# Patient Record
Sex: Female | Born: 1970 | Hispanic: Yes | Marital: Married | Smoking: Never smoker
Health system: Southern US, Community
[De-identification: ages and names within clinical notes are randomized; demographics above are authoritative.]

## PROBLEM LIST (undated history)

## (undated) HISTORY — PX: COLONOSCOPY: SHX174

## (undated) HISTORY — PX: LIPOMA EXCISION: SHX5283

---

## 2004-09-07 ENCOUNTER — Ambulatory Visit: Payer: Self-pay | Admitting: Otolaryngology

## 2016-06-04 ENCOUNTER — Other Ambulatory Visit: Payer: Self-pay | Admitting: Family Medicine

## 2016-06-04 ENCOUNTER — Ambulatory Visit
Admission: RE | Admit: 2016-06-04 | Discharge: 2016-06-04 | Disposition: A | Discharge status: Home / Self Care | Payer: Medicaid Other | Source: Ambulatory Visit | Attending: Family Medicine | Admitting: Family Medicine

## 2016-06-04 DIAGNOSIS — R937 Abnormal findings on diagnostic imaging of other parts of musculoskeletal system: Secondary | ICD-10-CM | POA: Insufficient documentation

## 2016-06-04 DIAGNOSIS — T1490XA Injury, unspecified, initial encounter: Secondary | ICD-10-CM

## 2016-06-04 DIAGNOSIS — W11XXXA Fall on and from ladder, initial encounter: Secondary | ICD-10-CM | POA: Insufficient documentation

## 2016-06-04 DIAGNOSIS — R102 Pelvic and perineal pain: Secondary | ICD-10-CM | POA: Insufficient documentation

## 2016-06-04 DIAGNOSIS — S300XXA Contusion of lower back and pelvis, initial encounter: Secondary | ICD-10-CM | POA: Insufficient documentation

## 2016-06-18 ENCOUNTER — Other Ambulatory Visit: Payer: Self-pay | Admitting: Specialist

## 2016-06-18 DIAGNOSIS — R102 Pelvic and perineal pain: Secondary | ICD-10-CM

## 2016-06-21 ENCOUNTER — Other Ambulatory Visit: Payer: Medicaid Other

## 2016-06-22 ENCOUNTER — Ambulatory Visit
Admission: RE | Admit: 2016-06-22 | Discharge: 2016-06-22 | Disposition: A | Discharge status: Home / Self Care | Payer: Medicaid Other | Source: Ambulatory Visit | Attending: Specialist | Admitting: Specialist

## 2016-06-22 DIAGNOSIS — R102 Pelvic and perineal pain: Secondary | ICD-10-CM | POA: Insufficient documentation

## 2020-09-08 ENCOUNTER — Other Ambulatory Visit: Payer: Self-pay | Admitting: Obstetrics and Gynecology

## 2020-09-08 DIAGNOSIS — Z1231 Encounter for screening mammogram for malignant neoplasm of breast: Secondary | ICD-10-CM

## 2020-09-30 ENCOUNTER — Other Ambulatory Visit: Payer: Self-pay

## 2020-09-30 ENCOUNTER — Ambulatory Visit
Admission: RE | Admit: 2020-09-30 | Discharge: 2020-09-30 | Disposition: A | Discharge status: Home / Self Care | Payer: 59 | Source: Ambulatory Visit | Attending: Obstetrics and Gynecology | Admitting: Obstetrics and Gynecology

## 2020-09-30 DIAGNOSIS — Z1231 Encounter for screening mammogram for malignant neoplasm of breast: Secondary | ICD-10-CM | POA: Diagnosis not present

## 2020-10-03 ENCOUNTER — Inpatient Hospital Stay
Admission: RE | Admit: 2020-10-03 | Discharge: 2020-10-03 | Disposition: A | Discharge status: Home / Self Care | Payer: Self-pay | Source: Ambulatory Visit | Attending: *Deleted | Admitting: *Deleted

## 2020-10-03 ENCOUNTER — Other Ambulatory Visit: Payer: Self-pay | Admitting: *Deleted

## 2020-10-03 DIAGNOSIS — Z1231 Encounter for screening mammogram for malignant neoplasm of breast: Secondary | ICD-10-CM

## 2021-03-27 ENCOUNTER — Other Ambulatory Visit
Admission: RE | Admit: 2021-03-27 | Discharge: 2021-03-27 | Disposition: A | Discharge status: Home / Self Care | Payer: 59 | Source: Ambulatory Visit | Attending: Surgery | Admitting: Surgery

## 2021-03-27 ENCOUNTER — Ambulatory Visit: Payer: Self-pay | Admitting: Surgery

## 2021-03-27 ENCOUNTER — Other Ambulatory Visit: Payer: Self-pay

## 2021-03-27 NOTE — Patient Instructions (Addendum)
Your procedure is scheduled on: 03/30/21 - Thursday Report to the Registration Desk on the 1st floor of the [facility]. To find out your arrival time, please call [phone] between 1PM - 3PM on: 03/29/21 - Wednesday  REMEMBER: Instructions that are not followed completely may result in serious medical risk, up to and including death; or upon the discretion of your surgeon and anesthesiologist your surgery may need to be rescheduled.  Do not eat food after midnight the night before surgery.  No gum chewing, lozengers or hard candies.  You may however, drink CLEAR liquids up to 2 hours before you are scheduled to arrive for your surgery. Do not drink anything within 2 hours of your scheduled arrival time.  Clear liquids include: - water  - apple juice without pulp - gatorade (not RED, PURPLE, OR BLUE) - black coffee or tea (Do NOT add milk or creamers to the coffee or tea) Do NOT drink anything that is not on this list.  TAKE THESE MEDICATIONS THE MORNING OF SURGERY WITH A SIP OF WATER: NONE  One week prior to surgery: Stop Anti-inflammatories (NSAIDS) such as Advil, Aleve, Ibuprofen, Motrin, Naproxen, Naprosyn and Aspirin based products such as Excedrin, Goodys Powder, BC Powder.  Stop ANY OVER THE COUNTER supplements until after surgery.  You may however, continue to take Tylenol if needed for pain up until the day of surgery.  No Alcohol for 24 hours before or after surgery.  No Smoking including e-cigarettes for 24 hours prior to surgery.  No chewable tobacco products for at least 6 hours prior to surgery.  No nicotine patches on the day of surgery.  Do not use any "recreational" drugs for at least a week prior to your surgery.  Please be advised that the combination of cocaine and anesthesia may have negative outcomes, up to and including death. If you test positive for cocaine, your surgery will be cancelled.  On the morning of surgery brush your teeth with toothpaste  and water, you may rinse your mouth with mouthwash if you wish. Do not swallow any toothpaste or mouthwash.  Use CHG Soap or wipes as directed on instruction sheet.  Do not wear jewelry, make-up, hairpins, clips or nail polish.  Do not wear lotions, powders, or perfumes.   Do not shave body from the neck down 48 hours prior to surgery just in case you cut yourself which could leave a site for infection.  Also, freshly shaved skin may become irritated if using the CHG soap.  Contact lenses, hearing aids and dentures may not be worn into surgery.  Do not bring valuables to the hospital. [facility] is not responsible for any missing/lost belongings or valuables.   Notify your doctor if there is any change in your medical condition (cold, fever, infection).  Wear comfortable clothing (specific to your surgery type) to the hospital.  After surgery, you can help prevent lung complications by doing breathing exercises.  Take deep breaths and cough every 1-2 hours. Your doctor may order a device called an Incentive Spirometer to help you take deep breaths. When coughing or sneezing, hold a pillow firmly against your incision with both hands. This is called splinting. Doing this helps protect your incision. It also decreases belly discomfort.  If you are being admitted to the hospital overnight, leave your suitcase in the car. After surgery it may be brought to your room.  If you are being discharged the day of surgery, you will not be allowed  to drive home. You will need a responsible adult (18 years or older) to drive you home and stay with you that night.   If you are taking public transportation, you will need to have a responsible adult (18 years or older) with you. Please confirm with your physician that it is acceptable to use public transportation.   Please call the [facility] Dept. at [phone] if you have any questions about these instructions.  Surgery  Visitation Policy:  Patients undergoing a surgery or procedure may have one family member or support person with them as long as that person is not COVID-19 positive or experiencing its symptoms.  That person may remain in the waiting area during the procedure and may rotate out with other people.  Inpatient Visitation:    Visiting hours are 7 a.m. to 8 p.m. Up to two visitors ages 16+ are allowed at one time in a patient room. The visitors may rotate out with other people during the day. Visitors must check out when they leave, or other visitors will not be allowed. One designated support person may remain overnight. The visitor must pass COVID-19 screenings, use hand sanitizer when entering and exiting the patients room and wear a mask at all times, including in the patients room. Patients must also wear a mask when staff or their visitor are in the room. Masking is required regardless of vaccination status.

## 2021-03-27 NOTE — H&P (Signed)
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 female who was referred by Cristy Folks, CNM for evaluation of above. First noted 1 year ago.  Asymptomatic but increasing in size.   Pt also due for colonoscopy.  Had one over 82yrs ago.  Asymptomatic otherwise.   Past Medical History: none reported   Past Surgical History:  has a past surgical history that includes other surgery (2007).   Family History: family history is not on file.   Social History:  reports that she has never smoked. She has never used smokeless tobacco. She reports that she does not currently use alcohol. She reports that she does not use drugs.   Current Medications: none reported   Allergies:  No Known Allergies   ROS:  A 15 point review of systems was performed and pertinent positives and negatives noted in HPI   Objective:   BP 131/84    Pulse 81    Ht 162.6 cm (5\' 4" )    Wt 84.8 kg (187 lb)    BMI 32.10 kg/m    Constitutional :  No distress, cooperative, alert Lymphatics/Throat:  Supple with no lymphadenopathy Respiratory:  Clear to auscultation bilaterally Cardiovascular:  Regular rate and rhythm Gastrointestinal: Soft, non-tender, non-distended, no organomegaly. 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  Alternatives include continued observation.  Benefits include possible symptom relief, pathologic evaluation, improved cosmesis. Discussed the risk of surgery including recurrence, chronic pain, post-op infxn, poor cosmesis, poor/delayed wound healing, and possible re-operation to address said risks. The risks of general  anesthetic, if used, includes MI, CVA, sudden death or even reaction to anesthetic medications also discussed.  Typical post-op recovery time of 3-5 days with possible activity restrictions were also discussed.   The patient verbalized understanding and all questions were answered to the patient's satisfaction.   Patient has elected to proceed with surgical treatment. Procedure will be scheduled. OR due to location and bleeding risk.   2. Colonoscopy Screening Discussed r/b/a of colonoscopy and indication for screening based on her age.  Also discussed cologuard.  She declined both at this time due to anxiety, but stated she will think about it.

## 2021-03-27 NOTE — H&P (View-Only) (Signed)
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 female who was referred by Cristy Folks, CNM for evaluation of above. First noted 1 year ago.  Asymptomatic but increasing in size.   Pt also due for colonoscopy.  Had one over 11yrs ago.  Asymptomatic otherwise.   Past Medical History: none reported   Past Surgical History:  has a past surgical history that includes other surgery (2007).   Family History: family history is not on file.   Social History:  reports that she has never smoked. She has never used smokeless tobacco. She reports that she does not currently use alcohol. She reports that she does not use drugs.   Current Medications: none reported   Allergies:  No Known Allergies   ROS:  A 15 point review of systems was performed and pertinent positives and negatives noted in HPI   Objective:   BP 131/84    Pulse 81    Ht 162.6 cm (5\' 4" )    Wt 84.8 kg (187 lb)    BMI 32.10 kg/m    Constitutional :  No distress, cooperative, alert Lymphatics/Throat:  Supple with no lymphadenopathy Respiratory:  Clear to auscultation bilaterally Cardiovascular:  Regular rate and rhythm Gastrointestinal: Soft, non-tender, non-distended, no organomegaly. 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  Alternatives include continued observation.  Benefits include possible symptom relief, pathologic evaluation, improved cosmesis. Discussed the risk of surgery including recurrence, chronic pain, post-op infxn, poor cosmesis, poor/delayed wound healing, and possible re-operation to address said risks. The risks of general  anesthetic, if used, includes MI, CVA, sudden death or even reaction to anesthetic medications also discussed.  Typical post-op recovery time of 3-5 days with possible activity restrictions were also discussed.   The patient verbalized understanding and all questions were answered to the patient's satisfaction.   Patient has elected to proceed with surgical treatment. Procedure will be scheduled. OR due to location and bleeding risk.   2. Colonoscopy Screening Discussed r/b/a of colonoscopy and indication for screening based on her age.  Also discussed cologuard.  She declined both at this time due to anxiety, but stated she will think about it.

## 2021-03-29 MED ORDER — CHLORHEXIDINE GLUCONATE CLOTH 2 % EX PADS: 6.0000 | MEDICATED_PAD | Freq: Once | CUTANEOUS | Status: DC

## 2021-03-29 MED ORDER — FAMOTIDINE 20 MG PO TABS: 20.0000 mg | ORAL_TABLET | Freq: Once | ORAL | Status: AC

## 2021-03-29 MED ORDER — CEFAZOLIN SODIUM-DEXTROSE 2-4 GM/100ML-% IV SOLN
2.0000 g | INTRAVENOUS | Status: AC
  Administered 2021-03-30: 2 g via INTRAVENOUS

## 2021-03-29 MED ORDER — LACTATED RINGERS IV SOLN: INTRAVENOUS | Status: DC

## 2021-03-29 MED ORDER — ORAL CARE MOUTH RINSE: 15.0000 mL | Freq: Once | OROMUCOSAL | Status: AC

## 2021-03-29 MED ORDER — CHLORHEXIDINE GLUCONATE 0.12 % MT SOLN: 15.0000 mL | Freq: Once | OROMUCOSAL | Status: AC

## 2021-03-30 ENCOUNTER — Ambulatory Visit: Payer: 59 | Admitting: Anesthesiology

## 2021-03-30 ENCOUNTER — Other Ambulatory Visit: Payer: Self-pay

## 2021-03-30 ENCOUNTER — Encounter: Payer: Self-pay | Admitting: Surgery

## 2021-03-30 ENCOUNTER — Ambulatory Visit
Admission: RE | Admit: 2021-03-30 | Discharge: 2021-03-30 | Disposition: A | Discharge status: Home / Self Care | Payer: 59 | Attending: Surgery | Admitting: Surgery

## 2021-03-30 ENCOUNTER — Encounter
Admission: RE | Disposition: A | Discharge status: Home / Self Care | Payer: Self-pay | Source: Home / Self Care | Attending: Surgery

## 2021-03-30 DIAGNOSIS — D170 Benign lipomatous neoplasm of skin and subcutaneous tissue of head, face and neck: Secondary | ICD-10-CM | POA: Insufficient documentation

## 2021-03-30 HISTORY — PX: LIPOMA EXCISION: SHX5283

## 2021-03-30 LAB — POCT PREGNANCY, URINE: Preg Test, Ur: NEGATIVE

## 2021-03-30 SURGERY — Surgical Case
Anesthesia: *Unknown

## 2021-03-30 SURGERY — EXCISION LIPOMA
Anesthesia: General | Site: Face | Laterality: Left

## 2021-03-30 MED ORDER — DOCUSATE SODIUM 100 MG PO CAPS: 100.0000 mg | ORAL_CAPSULE | Freq: Two times a day (BID) | ORAL | 0 refills | Status: AC | PRN

## 2021-03-30 MED ORDER — DEXAMETHASONE SODIUM PHOSPHATE 10 MG/ML IJ SOLN
INTRAMUSCULAR | Status: DC | PRN
Start: 2021-03-30 — End: 2021-03-30
  Administered 2021-03-30: 10 mg via INTRAVENOUS

## 2021-03-30 MED ORDER — LIDOCAINE HCL (CARDIAC) PF 100 MG/5ML IV SOSY
PREFILLED_SYRINGE | INTRAVENOUS | Status: DC | PRN
  Administered 2021-03-30: 80 mg via INTRAVENOUS

## 2021-03-30 MED ORDER — BUPIVACAINE-EPINEPHRINE 0.5% -1:200000 IJ SOLN
INTRAMUSCULAR | Status: DC | PRN
  Administered 2021-03-30: 1 mL

## 2021-03-30 MED ORDER — ACETAMINOPHEN 325 MG PO TABS: 650.0000 mg | ORAL_TABLET | Freq: Three times a day (TID) | ORAL | 0 refills | Status: DC | PRN

## 2021-03-30 MED ORDER — LIDOCAINE HCL (PF) 1 % IJ SOLN
INTRAMUSCULAR | Status: AC
  Filled 2021-03-30: qty 30

## 2021-03-30 MED ORDER — FENTANYL CITRATE (PF) 100 MCG/2ML IJ SOLN
INTRAMUSCULAR | Status: DC | PRN
  Administered 2021-03-30: 25 ug via INTRAVENOUS
  Administered 2021-03-30: 50 ug via INTRAVENOUS
  Administered 2021-03-30: 25 ug via INTRAVENOUS

## 2021-03-30 MED ORDER — ONDANSETRON HCL 4 MG/2ML IJ SOLN
INTRAMUSCULAR | Status: AC
  Filled 2021-03-30: qty 2

## 2021-03-30 MED ORDER — LIDOCAINE HCL (PF) 2 % IJ SOLN
INTRAMUSCULAR | Status: AC
  Filled 2021-03-30: qty 5

## 2021-03-30 MED ORDER — PHENYLEPHRINE HCL (PRESSORS) 10 MG/ML IV SOLN
INTRAVENOUS | Status: AC
  Filled 2021-03-30: qty 1

## 2021-03-30 MED ORDER — ROCURONIUM BROMIDE 10 MG/ML (PF) SYRINGE
PREFILLED_SYRINGE | INTRAVENOUS | Status: AC
  Filled 2021-03-30: qty 10

## 2021-03-30 MED ORDER — LIDOCAINE HCL (PF) 1 % IJ SOLN
INTRAMUSCULAR | Status: DC | PRN
  Administered 2021-03-30: 1 mL

## 2021-03-30 MED ORDER — MIDAZOLAM HCL 2 MG/2ML IJ SOLN
INTRAMUSCULAR | Status: AC
  Filled 2021-03-30: qty 2

## 2021-03-30 MED ORDER — ACETAMINOPHEN 10 MG/ML IV SOLN
INTRAVENOUS | Status: DC | PRN
Start: 2021-03-30 — End: 2021-03-30
  Administered 2021-03-30: 1000 mg via INTRAVENOUS

## 2021-03-30 MED ORDER — SUGAMMADEX SODIUM 200 MG/2ML IV SOLN
INTRAVENOUS | Status: DC | PRN
  Administered 2021-03-30 (×2): 200 mg via INTRAVENOUS

## 2021-03-30 MED ORDER — HYDROCODONE-ACETAMINOPHEN 5-325 MG PO TABS
ORAL_TABLET | ORAL | Status: AC
  Filled 2021-03-30: qty 1

## 2021-03-30 MED ORDER — ONDANSETRON HCL 4 MG/2ML IJ SOLN
INTRAMUSCULAR | Status: DC | PRN
  Administered 2021-03-30: 4 mg via INTRAVENOUS

## 2021-03-30 MED ORDER — PROPOFOL 10 MG/ML IV BOLUS
INTRAVENOUS | Status: AC
  Filled 2021-03-30: qty 40

## 2021-03-30 MED ORDER — HYDROCODONE-ACETAMINOPHEN 5-325 MG PO TABS: 1.0000 | ORAL_TABLET | Freq: Four times a day (QID) | ORAL | Status: DC | PRN

## 2021-03-30 MED ORDER — CHLORHEXIDINE GLUCONATE 0.12 % MT SOLN
OROMUCOSAL | Status: AC
  Administered 2021-03-30: 15 mL via OROMUCOSAL
  Filled 2021-03-30: qty 15

## 2021-03-30 MED ORDER — ACETAMINOPHEN 10 MG/ML IV SOLN
INTRAVENOUS | Status: AC
  Filled 2021-03-30: qty 100

## 2021-03-30 MED ORDER — DEXAMETHASONE SODIUM PHOSPHATE 10 MG/ML IJ SOLN
INTRAMUSCULAR | Status: AC
  Filled 2021-03-30: qty 1

## 2021-03-30 MED ORDER — FAMOTIDINE 20 MG PO TABS
ORAL_TABLET | ORAL | Status: AC
  Administered 2021-03-30: 20 mg via ORAL
  Filled 2021-03-30: qty 1

## 2021-03-30 MED ORDER — HYDROCODONE-ACETAMINOPHEN 5-325 MG PO TABS: 1.0000 | ORAL_TABLET | Freq: Four times a day (QID) | ORAL | 0 refills | Status: DC | PRN

## 2021-03-30 MED ORDER — FENTANYL CITRATE (PF) 100 MCG/2ML IJ SOLN
INTRAMUSCULAR | Status: AC
  Filled 2021-03-30: qty 2

## 2021-03-30 MED ORDER — ROCURONIUM BROMIDE 100 MG/10ML IV SOLN
INTRAVENOUS | Status: DC | PRN
  Administered 2021-03-30: 50 mg via INTRAVENOUS

## 2021-03-30 MED ORDER — BUPIVACAINE-EPINEPHRINE (PF) 0.5% -1:200000 IJ SOLN
INTRAMUSCULAR | Status: AC
  Filled 2021-03-30: qty 30

## 2021-03-30 MED ORDER — MIDAZOLAM HCL 2 MG/2ML IJ SOLN
INTRAMUSCULAR | Status: DC | PRN
  Administered 2021-03-30: 1 mg via INTRAVENOUS

## 2021-03-30 MED ORDER — PROPOFOL 10 MG/ML IV BOLUS
INTRAVENOUS | Status: DC | PRN
  Administered 2021-03-30: 150 mg via INTRAVENOUS

## 2021-03-30 MED ORDER — FENTANYL CITRATE (PF) 100 MCG/2ML IJ SOLN
25.0000 ug | INTRAMUSCULAR | Status: DC | PRN
  Administered 2021-03-30: 25 ug via INTRAVENOUS

## 2021-03-30 MED ORDER — IBUPROFEN 800 MG PO TABS: 800.0000 mg | ORAL_TABLET | Freq: Three times a day (TID) | ORAL | 0 refills | Status: DC | PRN

## 2021-03-30 MED ORDER — OXYCODONE HCL 5 MG PO TABS: 5.0000 mg | ORAL_TABLET | Freq: Once | ORAL | Status: DC | PRN

## 2021-03-30 MED ORDER — OXYCODONE HCL 5 MG/5ML PO SOLN: 5.0000 mg | Freq: Once | ORAL | Status: DC | PRN

## 2021-03-30 MED ORDER — CEFAZOLIN SODIUM-DEXTROSE 2-4 GM/100ML-% IV SOLN
INTRAVENOUS | Status: AC
  Filled 2021-03-30: qty 100

## 2021-03-30 SURGICAL SUPPLY — 34 items
ADH SKN CLS APL DERMABOND .7 (GAUZE/BANDAGES/DRESSINGS) ×1
APL PRP STRL LF DISP 70% ISPRP (MISCELLANEOUS)
BLADE SURG 15 STRL LF DISP TIS (BLADE) ×1 IMPLANT
BLADE SURG 15 STRL SS (BLADE) ×2
CHLORAPREP W/TINT 26 (MISCELLANEOUS) ×1 IMPLANT
DERMABOND ADVANCED (GAUZE/BANDAGES/DRESSINGS) ×1
DERMABOND ADVANCED .7 DNX12 (GAUZE/BANDAGES/DRESSINGS) ×1 IMPLANT
DRAPE 3/4 80X56 (DRAPES) ×1 IMPLANT
DRAPE LAPAROTOMY 100X77 ABD (DRAPES) ×2 IMPLANT
ELECT CAUTERY BLADE 6.4 (BLADE) ×2 IMPLANT
ELECT REM PT RETURN 9FT ADLT (ELECTROSURGICAL) ×2
ELECTRODE REM PT RTRN 9FT ADLT (ELECTROSURGICAL) ×1 IMPLANT
GAUZE 4X4 16PLY ~~LOC~~+RFID DBL (SPONGE) ×2 IMPLANT
GLOVE SURG SYN 6.5 ES PF (GLOVE) ×6 IMPLANT
GLOVE SURG SYN 6.5 PF PI (GLOVE) ×1 IMPLANT
GLOVE SURG UNDER POLY LF SZ7 (GLOVE) ×4 IMPLANT
GOWN STRL REUS W/ TWL LRG LVL3 (GOWN DISPOSABLE) ×2 IMPLANT
GOWN STRL REUS W/TWL LRG LVL3 (GOWN DISPOSABLE) ×6
KIT TURNOVER KIT A (KITS) ×2 IMPLANT
LABEL OR SOLS (LABEL) ×2 IMPLANT
MANIFOLD NEPTUNE II (INSTRUMENTS) ×2 IMPLANT
NEEDLE HYPO 22GX1.5 SAFETY (NEEDLE) ×2 IMPLANT
NS IRRIG 1000ML POUR BTL (IV SOLUTION) ×2 IMPLANT
PACK BASIN MINOR ARMC (MISCELLANEOUS) ×2 IMPLANT
SUT ETHILON 3-0 FS-10 30 BLK (SUTURE)
SUT MNCRL 4-0 (SUTURE) ×2
SUT MNCRL 4-0 27XMFL (SUTURE) ×1
SUT VIC AB 3-0 SH 27 (SUTURE) ×2
SUT VIC AB 3-0 SH 27X BRD (SUTURE) ×1 IMPLANT
SUTURE EHLN 3-0 FS-10 30 BLK (SUTURE) IMPLANT
SUTURE MNCRL 4-0 27XMF (SUTURE) ×1 IMPLANT
SYR 30ML LL (SYRINGE) ×2 IMPLANT
TOWEL OR 17X26 4PK STRL BLUE (TOWEL DISPOSABLE) ×1 IMPLANT
WATER STERILE IRR 500ML POUR (IV SOLUTION) ×1 IMPLANT

## 2021-03-30 NOTE — Discharge Instructions (Addendum)
Removal, Care After This sheet gives you information about how to care for yourself after your procedure. Your health care provider may also give you more specific instructions. If you have problems or questions, contact your health care provider. What can I expect after the procedure? After the procedure, it is common to have: Soreness. Bruising. Itching. Follow these instructions at home: site care Follow instructions from your health care provider about how to take care of your site. Make sure you: Wash your hands with soap and water before and after you change your bandage (dressing). If soap and water are not available, use hand sanitizer. Leave stitches (sutures), skin glue, or adhesive strips in place. These skin closures may need to stay in place for 2 weeks or longer. If adhesive strip edges start to loosen and curl up, you may trim the loose edges. Do not remove adhesive strips completely unless your health care provider tells you to do that. If the area bleeds or bruises, apply gentle pressure for 10 minutes. OK TO SHOWER IN 24HRS  Check your site every day for signs of infection. Check for: Redness, swelling, or pain. Fluid or blood. Warmth. Pus or a bad smell.  General instructions Rest and then return to your normal activities as told by your health care provider.  tylenol and advil as needed for discomfort.  Please alternate between the two every four hours as needed for pain.    Use narcotics, if prescribed, only when tylenol and motrin is not enough to control pain.  325-650mg every 8hrs to max of 3000mg/24hrs (including the 325mg in every norco dose) for the tylenol.    Advil up to 800mg per dose every 8hrs as needed for pain.   Keep all follow-up visits as told by your health care provider. This is important. Contact a health care provider if: You have redness, swelling, or pain around your site. You have fluid or blood coming from your site. Your site feels warm to  the touch. You have pus or a bad smell coming from your site. You have a fever. Your sutures, skin glue, or adhesive strips loosen or come off sooner than expected. Get help right away if: You have bleeding that does not stop with pressure or a dressing. Summary After the procedure, it is common to have some soreness, bruising, and itching at the site. Follow instructions from your health care provider about how to take care of your site. Check your site every day for signs of infection. Contact a health care provider if you have redness, swelling, or pain around your site, or your site feels warm to the touch. Keep all follow-up visits as told by your health care provider. This is important. This information is not intended to replace advice given to you by your health care provider. Make sure you discuss any questions you have with your health care provider. Document Released: 03/25/2015 Document Revised: 08/26/2017 Document Reviewed: 08/26/2017 Elsevier Interactive Patient Education  2019 [employer]  AMBULATORY SURGERY  DISCHARGE INSTRUCTIONS   The drugs that you were given will stay in your system until tomorrow so for the next 24 hours you should not:  Drive an automobile Make any legal decisions Drink any alcoholic beverage   You may resume regular meals tomorrow.  Today it is better to start with liquids and gradually work up to solid foods.  You may eat anything you prefer, but it is better to start with liquids, then soup and crackers, and gradually   work up to solid foods.   Please notify your doctor immediately if you have any unusual bleeding, trouble breathing, redness and pain at the surgery site, drainage, fever, or pain not relieved by medication.    Additional Instructions:        Please contact your physician with any problems or Same Day Surgery at [phone], Monday through Friday 6 am to 4 pm, or [facility] number at  [phone]. 

## 2021-03-30 NOTE — Anesthesia Procedure Notes (Signed)
Procedure Name: Intubation Date/Time: 03/30/2021 7:34 AM Performed by: Loletha Grayer, CRNA Pre-anesthesia Checklist: Patient identified, Patient being monitored, Timeout performed, Emergency Drugs available and Suction available Patient Re-evaluated:Patient Re-evaluated prior to induction Oxygen Delivery Method: Circle system utilized Preoxygenation: Pre-oxygenation with 100% oxygen Induction Type: IV induction Ventilation: Mask ventilation without difficulty and Oral airway inserted - appropriate to patient size Laryngoscope Size: 3 and McGraph Grade View: Grade I Tube type: Oral Tube size: 7.0 mm Number of attempts: 1 Airway Equipment and Method: Stylet Placement Confirmation: ETT inserted through vocal cords under direct vision, positive ETCO2 and breath sounds checked- equal and bilateral Secured at: 21 cm Tube secured with: Tape Dental Injury: Teeth and Oropharynx as per pre-operative assessment

## 2021-03-30 NOTE — Progress Notes (Signed)
°   03/30/21 0645  Clinical Encounter Type  Visited With Patient  Visit Type Pre-op;Initial;Spiritual support  Referral From Nurse  Spiritual Encounters  Spiritual Needs Prayer;Emotional  Stress Factors  Patient Stress Factors Other (Comment)  Family Stress Factors Other (Comment) (Nurse expressed that patient was nervous and prefers Spanish so I went in and talked and prayed with her in [place])   See note

## 2021-03-30 NOTE — Transfer of Care (Signed)
Immediate Anesthesia Transfer of Care Note  Patient: Maureen Holland  Procedure(s) Performed: EXCISION LIPOMA (Left: Face)  Patient Location: PACU  Anesthesia Type:General  Level of Consciousness: awake  Airway & Oxygen Therapy: Patient Spontanous Breathing and on room air  Post-op Assessment: Report given to RN and Post -op Vital signs reviewed and stable  Post vital signs: Reviewed and stable  Last Vitals:  Vitals Value Taken Time  BP 127/82 03/30/21 0815  Temp    Pulse 82 03/30/21 0820  Resp 17 03/30/21 0820  SpO2 97 % 03/30/21 0820  Vitals shown include unvalidated device data.  Last Pain:  Vitals:   03/30/21 0611  TempSrc: Oral  PainSc: 0-No pain         Complications: No notable events documented.

## 2021-03-30 NOTE — Interval H&P Note (Signed)
No change. OK to proceed.

## 2021-03-30 NOTE — Anesthesia Preprocedure Evaluation (Signed)
Anesthesia Evaluation  Patient identified by MRN, date of birth, ID band Patient awake    Reviewed: Allergy & Precautions, NPO status , Patient's Chart, lab work & pertinent test results  History of Anesthesia Complications Negative for: history of anesthetic complications  Airway Mallampati: III  TM Distance: >3 FB Neck ROM: full    Dental  (+) Chipped   Pulmonary neg pulmonary ROS, neg shortness of breath,    Pulmonary exam normal        Cardiovascular Exercise Tolerance: Good (-) angina(-) Past MI negative cardio ROS Normal cardiovascular exam     Neuro/Psych negative neurological ROS  negative psych ROS   GI/Hepatic negative GI ROS, Neg liver ROS, neg GERD  ,  Endo/Other  negative endocrine ROS  Renal/GU      Musculoskeletal   Abdominal   Peds  Hematology negative hematology ROS (+)   Anesthesia Other Findings History reviewed. No pertinent past medical history.  Past Surgical History: No date: COLONOSCOPY No date: LIPOMA EXCISION     Reproductive/Obstetrics negative OB ROS                             Anesthesia Physical Anesthesia Plan  ASA: 1  Anesthesia Plan: General ETT   Post-op Pain Management:    Induction: Intravenous  PONV Risk Score and Plan: Ondansetron, Dexamethasone, Midazolam and Treatment may vary due to age or medical condition  Airway Management Planned: Oral ETT  Additional Equipment:   Intra-op Plan:   Post-operative Plan: Extubation in OR  Informed Consent: I have reviewed the patients History and Physical, chart, labs and discussed the procedure including the risks, benefits and alternatives for the proposed anesthesia with the patient or authorized representative who has indicated his/her understanding and acceptance.     Dental Advisory Given  Plan Discussed with: Anesthesiologist, CRNA and Surgeon  Anesthesia Plan Comments: (Patient  consented for risks of anesthesia including but not limited to:  - adverse reactions to medications - damage to eyes, teeth, lips or other oral mucosa - nerve damage due to positioning  - sore throat or hoarseness - Damage to heart, brain, nerves, lungs, other parts of body or loss of life  Patient voiced understanding.)        Anesthesia Quick Evaluation

## 2021-03-30 NOTE — Op Note (Signed)
Pre-Op Dx: lipoma face Post-Op Dx: same Anesthesia: GETA EBL: 19mL Complications:  none apparent Specimen: lipoma Procedure: excisional biopsy of lipoma face Surgeon: Lysle Pearl  Indications for procedure: See H&P  Description of Procedure:  Consent obtained, time out performed.  Patient placed in supine position.  Area sterilized and draped in usual position.  Local infused to area previously marked.  3cm incision made through dermis with 15blade and lipoma noted in muscle layer.  The  1.3cm x 2cm x 1cm lipoma then removed from surrounding tissue completely using electrocautery, passed off field pending pathology.  Wound hemostasis noted, then closed with running 4-0 monocryl in subcuticular fashion for epidermal layer.  Wound then dressed with dermabond.  Pt tolerated procedure well, and transferred to PACU in stable condition. Sponge and instrument count correct at end of procedure.

## 2021-03-30 NOTE — Anesthesia Postprocedure Evaluation (Signed)
Anesthesia Post Note  Patient: Maureen Holland  Procedure(s) Performed: EXCISION LIPOMA (Left: Face)  Patient location during evaluation: PACU Anesthesia Type: General Level of consciousness: awake and alert Pain management: pain level controlled Vital Signs Assessment: post-procedure vital signs reviewed and stable Respiratory status: spontaneous breathing, nonlabored ventilation, respiratory function stable and patient connected to nasal cannula oxygen Cardiovascular status: blood pressure returned to baseline and stable Postop Assessment: no apparent nausea or vomiting Anesthetic complications: no   No notable events documented.   Last Vitals:  Vitals:   03/30/21 0845 03/30/21 0908  BP: 129/80 126/78  Pulse: 66 64  Resp: 13 18  Temp:  36.4 C  SpO2: 96% 97%    Last Pain:  Vitals:   03/30/21 0908  TempSrc:   PainSc: 0-No pain                 Precious Haws Apolo Cutshaw

## 2021-03-31 LAB — SURGICAL PATHOLOGY

## 2021-04-22 ENCOUNTER — Emergency Department
Admission: EM | Admit: 2021-04-22 | Discharge: 2021-04-22 | Disposition: A | Discharge status: Home / Self Care | Payer: 59 | Attending: Emergency Medicine | Admitting: Emergency Medicine

## 2021-04-22 ENCOUNTER — Emergency Department: Payer: 59

## 2021-04-22 ENCOUNTER — Other Ambulatory Visit: Payer: Self-pay

## 2021-04-22 DIAGNOSIS — M5441 Lumbago with sciatica, right side: Secondary | ICD-10-CM | POA: Insufficient documentation

## 2021-04-22 DIAGNOSIS — M545 Low back pain, unspecified: Secondary | ICD-10-CM | POA: Diagnosis present

## 2021-04-22 DIAGNOSIS — M5431 Sciatica, right side: Secondary | ICD-10-CM

## 2021-04-22 LAB — POC URINE PREG, ED: Preg Test, Ur: NEGATIVE

## 2021-04-22 LAB — URINALYSIS, ROUTINE W REFLEX MICROSCOPIC
Bilirubin Urine: NEGATIVE
Glucose, UA: >=500 mg/dL — AB
Ketones, ur: NEGATIVE mg/dL
Nitrite: NEGATIVE
Protein, ur: NEGATIVE mg/dL
Specific Gravity, Urine: 1.012 (ref 1.005–1.030)
Squamous Epithelial / HPF: >50 — ABNORMAL HIGH (ref 0–5)
pH: 5 (ref 5.0–8.0)

## 2021-04-22 LAB — CBG MONITORING, ED: Glucose-Capillary: 151 mg/dL — ABNORMAL HIGH (ref 70–99)

## 2021-04-22 MED ORDER — HYDROCODONE-ACETAMINOPHEN 5-325 MG PO TABS: 1.0000 | ORAL_TABLET | Freq: Four times a day (QID) | ORAL | 0 refills | Status: AC | PRN

## 2021-04-22 MED ORDER — PREDNISONE 10 MG PO TABS: ORAL_TABLET | ORAL | 0 refills | Status: AC

## 2021-04-22 MED ORDER — OXYCODONE-ACETAMINOPHEN 5-325 MG PO TABS
1.0000 | ORAL_TABLET | Freq: Once | ORAL | Status: AC
  Administered 2021-04-22: 1 via ORAL
  Filled 2021-04-22: qty 1

## 2021-04-22 MED ORDER — METHOCARBAMOL 500 MG PO TABS
500.0000 mg | ORAL_TABLET | Freq: Four times a day (QID) | ORAL | 0 refills | Status: AC | PRN
Start: 2021-04-22 — End: ?

## 2021-04-22 MED ORDER — METHOCARBAMOL 500 MG PO TABS
500.0000 mg | ORAL_TABLET | Freq: Once | ORAL | Status: AC
  Administered 2021-04-22: 500 mg via ORAL
  Filled 2021-04-22: qty 1

## 2021-04-22 NOTE — Discharge Instructions (Addendum)
Follow-up with your primary care provider for a recheck of your blood sugar as you had glucose noted in your urine.  3 prescriptions were sent to your pharmacy.  Prescription for prednisone is to taper down by 1 tablet each day.  This is a medication will help with radiation of pain down your right leg.  The pain medication and muscle relaxant together may cause drowsiness and increase your risk for injury.  Do not drive or operate machinery while taking this medications.  The prednisone will not cause any drowsiness.  You may use ice or heat to your back as needed for discomfort.  After you have been completely pain-free you can slowly start doing some exercises to strengthen your core muscles which will also help with your back pain in the future.  Decrease the amount of weight that you are lifting while you are doing your garden, no lifting pushing or pulling at this time which will aggravate your back.  At this time your blood pressure has improved and your blood pressure may have been elevated just because you are in pain when you first came to the emergency department.  This is also something that needs to be checked by your primary care provider.

## 2021-04-22 NOTE — ED Provider Notes (Signed)
[facility] Provider Note    Event Date/Time   First MD Initiated Contact with Patient 04/22/21 1347     (approximate)   History   Back Pain   HPI  Spanish interpreter via [employer] and daughter who is present.  Maureen Holland is a 51 y.o. female   presents to the ED with complaint of low back pain with radiation leg that started several days ago.  Patient is not aware of any recent injury.  Patient has a history of falling approximately 9 feet in 2018 and was seen and told that there was no fracture.  Patient is very active and has been working in her garden prior to the sciatica that she is experiencing now.  She denies any urinary symptoms and has tried over-the-counter medication without any relief of her discomfort.  She rates her pain currently as a 10/10.      Physical Exam   Triage Vital Signs: ED Triage Vitals  Enc Vitals Group     BP 04/22/21 1247 (!) 150/107     Pulse Rate 04/22/21 1247 82     Resp 04/22/21 1247 17     Temp 04/22/21 1247 98 F (36.7 C)     Temp src --      SpO2 04/22/21 1249 99 %     Weight 04/22/21 1247 185 lb (83.9 kg)     Height 04/22/21 1247 5\' 4"  (1.626 m)     Head Circumference --      Peak Flow --      Pain Score 04/22/21 1247 10     Pain Loc --      Pain Edu? --      Excl. in [place]? --     Most recent vital signs: Vitals:   04/22/21 1249 04/22/21 1548  BP:  118/69  Pulse:  69  Resp:  20  Temp:    SpO2: 99% 99%     General: Awake, no distress.  CV:  Good peripheral perfusion.  Heart regular rate and rhythm without murmur. Resp:  Normal effort.  Clear bilaterally. Abd:  No distention.  Soft, nontender. Other:  On examination of the back there is no gross deformity however there is tenderness on palpation of L5-S1 area and right sacral area to palpation specifically the SI joint.  Range of motion is slow and guarded.  Straight leg raises are approximately 20 degrees.  Good muscle strength bilaterally  at 5/5.   ED Results / Procedures / Treatments   Labs (all labs ordered are listed, but only abnormal results are displayed) Labs Reviewed  URINALYSIS, ROUTINE W REFLEX MICROSCOPIC - Abnormal; Notable for the following components:      Result Value   Color, Urine YELLOW (*)    APPearance CLOUDY (*)    Glucose, UA >=500 (*)    Hgb urine dipstick SMALL (*)    Leukocytes,Ua TRACE (*)    Bacteria, UA FEW (*)    Squamous Epithelial / LPF >50 (*)    All other components within normal limits  CBG MONITORING, ED - Abnormal; Notable for the following components:   Glucose-Capillary 151 (*)    All other components within normal limits  POC URINE PREG, ED      RADIOLOGY  Lumbar spine x-ray images were reviewed by myself and no acute fracture noted.  Radiology report mild curvature to the right with straightening of the normal lumbar lordosis.  Mild disc space narrowing at L2-3 and L3-4.  Lumbar region facet osteoarthritis could also be painful.  Pelvis x-ray images were reviewed by myself and no acute fracture was seen.  Radiology report was negative for acute changes.   PROCEDURES:  Critical Care performed:   Procedures   MEDICATIONS ORDERED IN ED: Medications  oxyCODONE-acetaminophen (PERCOCET/ROXICET) 5-325 MG per tablet 1 tablet (1 tablet Oral Given 04/22/21 1455)  methocarbamol (ROBAXIN) tablet 500 mg (500 mg Oral Given 04/22/21 1455)     IMPRESSION / MDM / ASSESSMENT AND PLAN / ED COURSE  I reviewed the triage vital signs and the nursing notes.   Differential diagnosis includes, but is not limited to, muscle skeletal pain, muscle skeletal strain, degenerative disc disease, sciatica, compression fracture, iliosacral fracture.  51 year old female presents to the ED with complaint of low back pain with radiation to her right leg down to her ankle.  Patient has had an injury to her back in 2018 but was seen and told that there was no fracture.  She states that she works in  her garden.e and for the last several days has experienced this pain and radiation.  X-ray images of the lumbar spine and pelvis were reviewed by me and were radiology report was negative for any acute changes.  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  Also glucose was noted in her urine and glucose fingerstick in the ED was 151.  We discussed her elevated blood pressure which may be due to pain and prior to discharge patient's blood pressure was within normal limits.  She is strongly encouraged to follow-up with her PCP for recheck of her glucose and further evaluation of her blood pressure.  We discussed starting some exercises to increase her core muscles once she is completely pain-free starting with a very small amount of exercises and working her way up.  Daughter currently is going through a PT program and is aware of what I am talking about.  A prescription for prednisone taper dose, Norco and methocarbamol was sent to her pharmacy.  She is also encouraged to use ice or heat to her back as needed for discomfort.    FINAL CLINICAL IMPRESSION(S) / ED DIAGNOSES   Final diagnoses:  Sciatica of right side  Acute right-sided low back pain with right-sided sciatica     Rx / DC Orders   ED Discharge Orders          Ordered    methocarbamol (ROBAXIN) 500 MG tablet  Every 6 hours PRN        04/22/21 1613    predniSONE (DELTASONE) 10 MG tablet        04/22/21 1613    HYDROcodone-acetaminophen (NORCO/VICODIN) 5-325 MG tablet  Every 6 hours PRN        04/22/21 1613             Note:  This document was prepared using Dragon voice recognition software and may include unintentional dictation errors.   Johnn Hai, PA-C 04/22/21 1641    Blake Divine, MD 04/22/21 [phone]

## 2021-04-22 NOTE — ED Triage Notes (Signed)
Patient c/o lower back/hip pain radiating down leg. Patient reports intermittent pain to same area since 2018 after a fall of 10 feet. Patient reports this pain is worse than her normal.

## 2021-04-22 NOTE — ED Notes (Signed)
ED Provider at bedside. 

## 2021-05-18 ENCOUNTER — Encounter: Admission: RE | Payer: Self-pay | Source: Home / Self Care

## 2021-05-18 ENCOUNTER — Ambulatory Visit: Admission: RE | Admit: 2021-05-18 | Payer: 59 | Source: Home / Self Care | Admitting: Surgery

## 2021-05-18 SURGERY — COLONOSCOPY WITH PROPOFOL
Anesthesia: General

## 2024-02-06 IMAGING — CR DG PELVIS 1-2V
1 series · 1 of 1 positions shown · non-contrast
Comparison: 06/04/2016.

CLINICAL DATA: Patient c/o lower back and Right hip pain radiating
down leg. Patient reports intermittent pain to same area since 2423
after a fall of 10 feet. Patient reports this pain is worse than her
normal. Pain described as sharp pin pricks which is worse with
flexion/extension and when sitting down.

EXAM:
PELVIS - 1-2 VIEW

[pelvis ap]
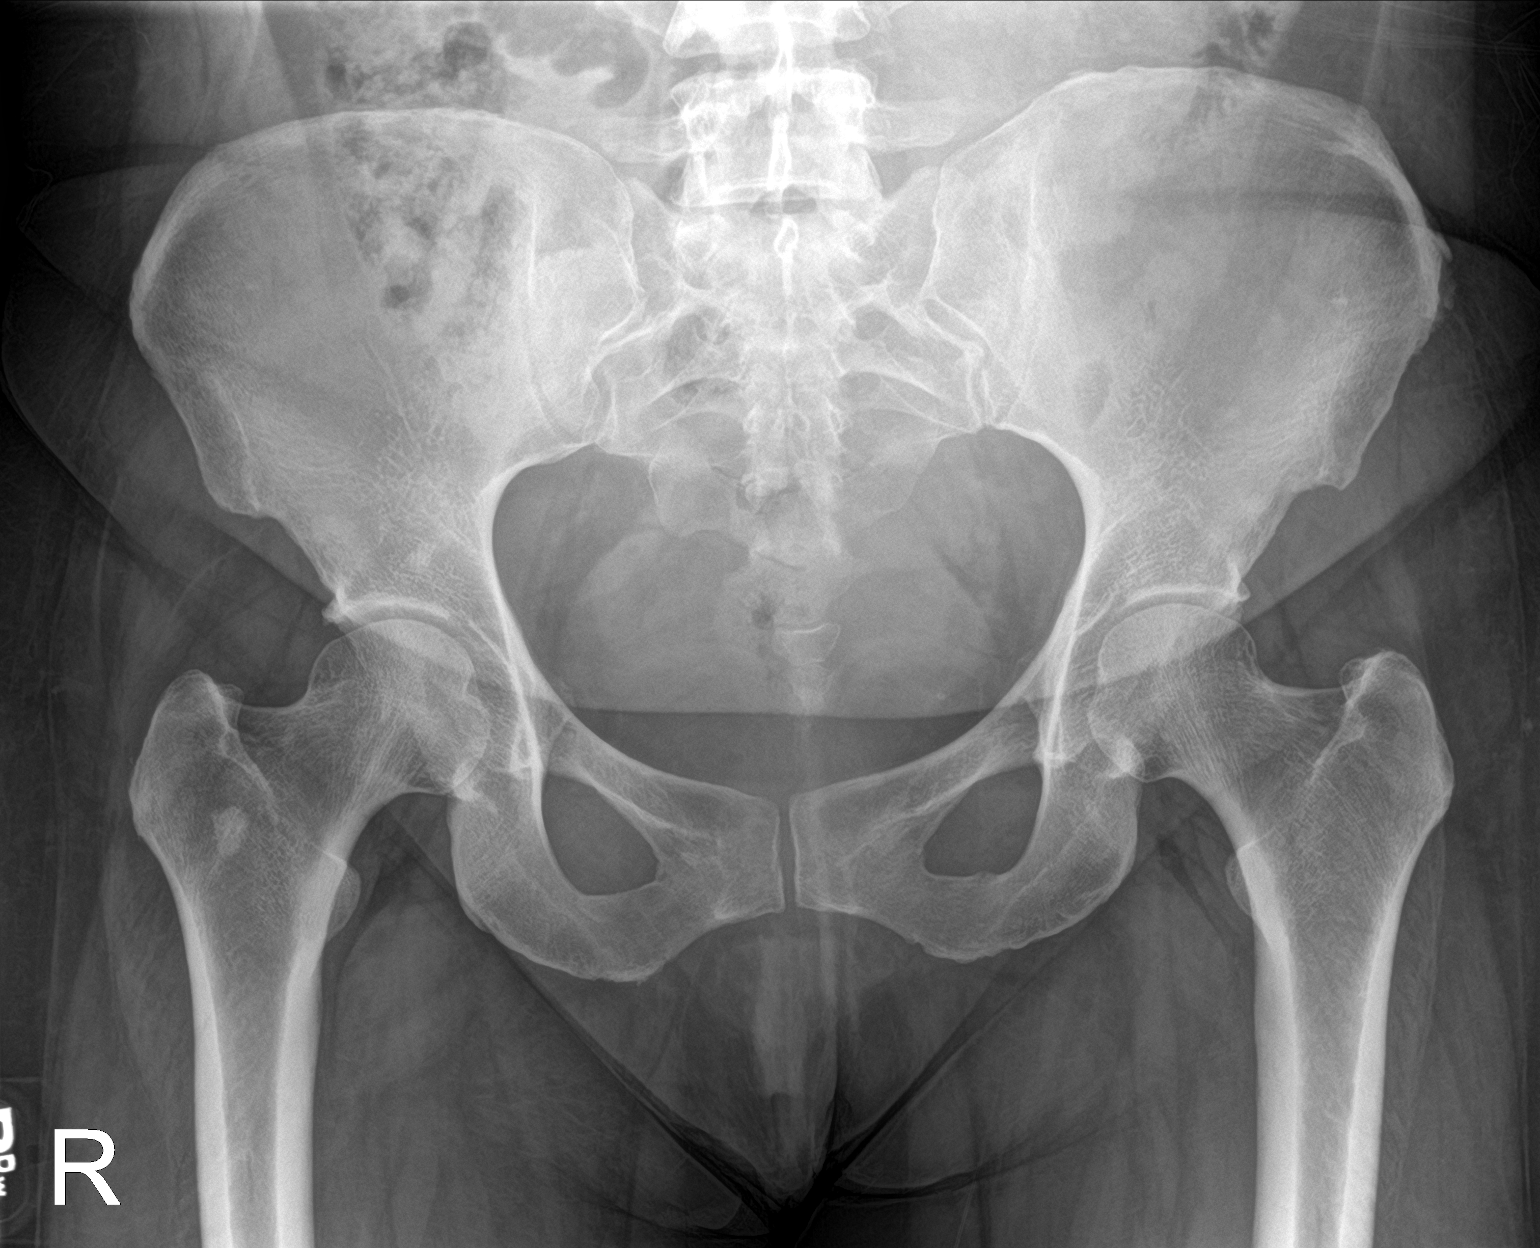

[1 of 1 positions shown; findings below may reference images not displayed]

FINDINGS: No fracture. Stable benign bone island in the proximal right femur.
No aggressive/suspicious bone lesions.

Hip joints, SI joints and pubic symphysis are normally spaced and
aligned.

Soft tissues are unremarkable.
IMPRESSION: 1. No fracture.  No significant joint abnormality.
